# Patient Record
Sex: Male | Born: 1975 | Race: Black or African American | Hispanic: No | Marital: Married | Smoking: Never smoker
Health system: Southern US, Community
[De-identification: ages and names within clinical notes are randomized; demographics above are authoritative.]

---

## 1999-01-05 ENCOUNTER — Emergency Department (HOSPITAL_COMMUNITY): Admission: EM | Admit: 1999-01-05 | Discharge: 1999-01-05 | Payer: Self-pay | Admitting: *Deleted

## 2001-06-27 ENCOUNTER — Emergency Department (HOSPITAL_COMMUNITY): Admission: EM | Admit: 2001-06-27 | Discharge: 2001-06-28 | Payer: Self-pay | Admitting: *Deleted

## 2001-12-03 ENCOUNTER — Encounter: Payer: Self-pay | Admitting: Emergency Medicine

## 2001-12-03 ENCOUNTER — Emergency Department (HOSPITAL_COMMUNITY): Admission: EM | Admit: 2001-12-03 | Discharge: 2001-12-03 | Payer: Self-pay | Admitting: Emergency Medicine

## 2009-09-03 ENCOUNTER — Emergency Department (HOSPITAL_COMMUNITY): Admission: EM | Admit: 2009-09-03 | Discharge: 2009-09-03 | Payer: Self-pay | Admitting: Emergency Medicine

## 2010-09-20 LAB — BASIC METABOLIC PANEL
BUN: 16 mg/dL (ref 6–23)
CO2: 27 mEq/L (ref 19–32)
Calcium: 9.1 mg/dL (ref 8.4–10.5)
Chloride: 103 mEq/L (ref 96–112)
Creatinine, Ser: 1.18 mg/dL (ref 0.4–1.5)
GFR calc Af Amer: >60 mL/min (ref >60)
GFR calc non Af Amer: >60 mL/min (ref >60)
Glucose, Bld: 141 mg/dL — ABNORMAL HIGH (ref 70–99)
Potassium: 3.4 mEq/L — ABNORMAL LOW (ref 3.5–5.1)
Sodium: 136 mEq/L (ref 135–145)

## 2010-09-20 LAB — CBC
HCT: 41.6 % (ref 39.0–52.0)
Hemoglobin: 13.4 g/dL (ref 13.0–17.0)
MCHC: 32.3 g/dL (ref 30.0–36.0)
MCV: 88.9 fL (ref 78.0–100.0)
RBC: 4.68 MIL/uL (ref 4.22–5.81)

## 2010-09-20 LAB — URINALYSIS, ROUTINE W REFLEX MICROSCOPIC
Bilirubin Urine: NEGATIVE
Glucose, UA: NEGATIVE mg/dL
Hgb urine dipstick: NEGATIVE
Ketones, ur: NEGATIVE mg/dL
Nitrite: NEGATIVE
Protein, ur: NEGATIVE mg/dL
Specific Gravity, Urine: 1.023 (ref 1.005–1.030)
Urobilinogen, UA: 0.2 mg/dL (ref 0.0–1.0)
pH: 7 (ref 5.0–8.0)

## 2010-09-20 LAB — DIFFERENTIAL
Basophils Relative: 1 % (ref 0–1)
Eosinophils Absolute: 0.3 10*3/uL (ref 0.0–0.7)
Monocytes Absolute: 0.6 10*3/uL (ref 0.1–1.0)
Monocytes Relative: 8 % (ref 3–12)

## 2014-12-04 ENCOUNTER — Ambulatory Visit (INDEPENDENT_AMBULATORY_CARE_PROVIDER_SITE_OTHER): Payer: 59 | Admitting: Physician Assistant

## 2014-12-04 VITALS — BP 122/78 | HR 61 | Temp 97.8°F | Resp 17 | Ht 66.5 in | Wt 160.8 lb

## 2014-12-04 DIAGNOSIS — Z1329 Encounter for screening for other suspected endocrine disorder: Secondary | ICD-10-CM

## 2014-12-04 DIAGNOSIS — Z130 Encounter for screening for diseases of the blood and blood-forming organs and certain disorders involving the immune mechanism: Secondary | ICD-10-CM

## 2014-12-04 DIAGNOSIS — Z1389 Encounter for screening for other disorder: Secondary | ICD-10-CM

## 2014-12-04 DIAGNOSIS — Z0000 Encounter for general adult medical examination without abnormal findings: Secondary | ICD-10-CM | POA: Diagnosis not present

## 2014-12-04 DIAGNOSIS — Z131 Encounter for screening for diabetes mellitus: Secondary | ICD-10-CM

## 2014-12-04 LAB — POCT CBC
GRANULOCYTE PERCENT: 43 % (ref 37–80)
HCT, POC: 42.9 % — AB (ref 43.5–53.7)
Hemoglobin: 14.4 g/dL (ref 14.1–18.1)
Lymph, poc: 2.3 (ref 0.6–3.4)
MCH, POC: 28.4 pg (ref 27–31.2)
MCHC: 33.5 g/dL (ref 31.8–35.4)
MCV: 84.8 fL (ref 80–97)
MID (cbc): 0.3 (ref 0–0.9)
MPV: 6.4 fL (ref 0–99.8)
POC GRANULOCYTE: 2 (ref 2–6.9)
POC LYMPH PERCENT: 50 %L (ref 10–50)
POC MID %: 7 % (ref 0–12)
Platelet Count, POC: 283 10*3/uL (ref 142–424)
RBC: 5.05 M/uL (ref 4.69–6.13)
RDW, POC: 13.3 %
WBC: 4.7 10*3/uL (ref 4.6–10.2)

## 2014-12-04 LAB — POCT GLYCOSYLATED HEMOGLOBIN (HGB A1C): HEMOGLOBIN A1C: 5.5

## 2014-12-04 NOTE — Progress Notes (Signed)
12/04/2014 at 6:00 PM  Ronald Caldwell / DOB: 1976-01-21 / MRN: [id number]  The patient  does not have a problem list on file.  SUBJECTIVE  Chief complaint: Annual Exam  Patient here for an annual exam. Ronald Caldwell has no complaints.  Report Ronald Caldwell had a tetanus shot roughly five to seven years ago. Ronald Caldwell does not smoke, drink, and denies illicit drugs.  Denies anhedonia and dysthymia. Denies a family history of prostate cancer. Denies personal and family history of diabetes and CAD. Ronald Caldwell has been screened negative for HIV in the past and has had no new partners since that time. Ronald Caldwell has never been screened for syphilis.   Ronald Caldwell  has no past medical history on file.    Medications reviewed and updated by myself where necessary, and exist elsewhere in the encounter.   Ronald Caldwell has no allergies on file. Ronald Caldwell  reports that Ronald Caldwell has never smoked. Ronald Caldwell does not have any smokeless tobacco history on file. Ronald Caldwell  has no sexual activity history on file. The patient  has no past surgical history on file.  Ronald Caldwell family history includes Hypertension in Ronald Caldwell maternal grandmother.  Review of Systems  Constitutional: Negative for fever and chills.  Eyes: Negative.   Respiratory: Negative.   Cardiovascular: Negative.   Gastrointestinal: Negative.   Genitourinary: Negative.   Musculoskeletal: Negative.   Skin: Negative for itching and rash.  Neurological: Negative for dizziness and headaches.  Psychiatric/Behavioral: Negative for depression. The patient is not nervous/anxious.     OBJECTIVE  Ronald Caldwell  height is 5' 6.5" (1.689 m) and weight is 160 lb 12.8 oz (72.938 kg). Ronald Caldwell oral temperature is 97.8 F (36.6 C). Ronald Caldwell blood pressure is 122/78 and Ronald Caldwell pulse is 61. Ronald Caldwell respiration is 17 and oxygen saturation is 99%.  The patient's body mass index is 25.57 kg/(m^2).  Physical Exam  Vitals reviewed. Constitutional: Ronald Caldwell is oriented to person, place, and time. Ronald Caldwell appears well-developed and well-nourished.  HENT:  Head: Normocephalic.    Right Ear: External ear normal.  Left Ear: External ear normal.  Nose: Nose normal.  Mouth/Throat: No oropharyngeal exudate.  Eyes: Conjunctivae are normal. Pupils are equal, round, and reactive to light.  Neck: Normal range of motion.  Cardiovascular: Normal rate and regular rhythm.   Respiratory: Effort normal and breath sounds normal.  GI: Soft. Bowel sounds are normal. Ronald Caldwell exhibits no distension and no mass. There is no tenderness. There is no rebound and no guarding.  Genitourinary: Rectum normal, prostate normal and penis normal. Guaiac negative stool. No penile tenderness.  Musculoskeletal: Normal range of motion.  Neurological: Ronald Caldwell is alert and oriented to person, place, and time. Ronald Caldwell displays normal reflexes. No cranial nerve deficit. Ronald Caldwell exhibits normal muscle tone. Coordination normal.  Skin: Skin is dry. No rash noted. No erythema. No pallor.  Psychiatric: Ronald Caldwell has a normal mood and affect. Ronald Caldwell behavior is normal. Judgment and thought content normal.    Results for orders placed or performed in visit on 12/04/14 (from the past 24 hour(s))  POCT glycosylated hemoglobin (Hb A1C)     Status: None   Collection Time: 12/04/14  5:59 PM  Result Value Ref Range   Hemoglobin A1C 5.5   POCT CBC     Status: Abnormal   Collection Time: 12/04/14  5:59 PM  Result Value Ref Range   WBC 4.7 4.6 - 10.2 K/uL   Lymph, poc 2.3 0.6 - 3.4   POC LYMPH PERCENT 50.0 10 - 50 %L  MID (cbc) 0.3 0 - 0.9   POC MID % 7.0 0 - 12 %M   POC Granulocyte 2.0 2 - 6.9   Granulocyte percent 43.0 37 - 80 %G   RBC 5.05 4.69 - 6.13 M/uL   Hemoglobin 14.4 14.1 - 18.1 g/dL   HCT, POC 04.5 (A) 40.9 - 53.7 %   MCV 84.8 80 - 97 fL   MCH, POC 28.4 27 - 31.2 pg   MCHC 33.5 31.8 - 35.4 g/dL   RDW, POC 81.1 %   Platelet Count, POC 283 142 - 424 K/uL   MPV 6.4 0 - 99.8 fL    ASSESSMENT & PLAN  Arvie was seen today for annual exam.  Diagnoses and all orders for this visit:  Annual physical exam  Screening for  deficiency anemia Orders: -     POCT CBC  Screening for nephropathy Orders: -     Comprehensive metabolic panel  Screening for diabetes mellitus Orders: -     POCT glycosylated hemoglobin (Hb A1C)  Thyroid disorder screen Orders: -     TSH      The patient was advised to call or come back to clinic if Ronald Caldwell does not see an improvement in symptoms, or worsens with the above plan.   Deliah Boston, MHS, PA-C [facility] 12/04/2014 6:00 PM

## 2014-12-05 LAB — TSH: TSH: 3.56 u[IU]/mL (ref 0.350–4.500)

## 2014-12-05 LAB — COMPREHENSIVE METABOLIC PANEL
ALK PHOS: 43 U/L (ref 39–117)
ALT: 19 U/L (ref 0–53)
AST: 22 U/L (ref 0–37)
Albumin: 4.8 g/dL (ref 3.5–5.2)
BILIRUBIN TOTAL: 1.7 mg/dL — AB (ref 0.2–1.2)
BUN: 9 mg/dL (ref 6–23)
CO2: 26 meq/L (ref 19–32)
Calcium: 9.7 mg/dL (ref 8.4–10.5)
Chloride: 101 mEq/L (ref 96–112)
Creat: 1.12 mg/dL (ref 0.50–1.35)
GLUCOSE: 88 mg/dL (ref 70–99)
POTASSIUM: 4.7 meq/L (ref 3.5–5.3)
SODIUM: 136 meq/L (ref 135–145)
Total Protein: 7.8 g/dL (ref 6.0–8.3)

## 2014-12-08 ENCOUNTER — Encounter: Payer: Self-pay | Admitting: Family Medicine

## 2015-05-11 ENCOUNTER — Telehealth: Payer: Self-pay

## 2015-05-11 NOTE — Telephone Encounter (Signed)
PT DROPPED OFF TEACHER PE FORM TO BE COMPLETED   pHONE [phone]

## 2015-05-12 NOTE — Telephone Encounter (Signed)
Patient called to check on status of his phy form by Sherril CroonMike Clark   [phone]

## 2015-05-13 NOTE — Telephone Encounter (Signed)
This was completed last night. Deliah BostonMichael Clark, MS, PA-C 2:20 PM, 05/13/2015

## 2015-10-15 ENCOUNTER — Ambulatory Visit (INDEPENDENT_AMBULATORY_CARE_PROVIDER_SITE_OTHER): Payer: 59 | Admitting: Family Medicine

## 2015-10-15 ENCOUNTER — Encounter: Payer: Self-pay | Admitting: Family Medicine

## 2015-10-15 VITALS — BP 122/83 | HR 58 | Ht 66.0 in | Wt 162.0 lb

## 2015-10-15 DIAGNOSIS — M25561 Pain in right knee: Secondary | ICD-10-CM | POA: Diagnosis not present

## 2015-10-15 NOTE — Patient Instructions (Signed)
You have patellar tendinitis. Icing 15 minutes at a time 3-4 times a day when this is hurting you. Do straight leg raises, knee extensions, decline half-squats 3 sets of 10 once a day. Can add ankle weight if these become too easy. Ibuprofen 600mg  three times a day with food OR aleve 2 tabs twice a day with food for pain and inflammation. Consider nitro patches, physical therapy if not improving Use the knee brace or patellar strap when up and walking around. Follow up with me in 6 weeks.

## 2015-10-16 NOTE — Progress Notes (Signed)
PCP: No primary care provider on file.  Subjective:   HPI: Patient is a 40 y.o. male here for right knee pain.  Patient reports he's had anterior knee pain for about 4-5 years. Works a lot on his feet for up to 12 hours at a time. No injury or trauma. Pain localized anteriorly, worse with stairs and getting up and down. Pain level 3/10 at worst, dull. Is getting better shoes.   No catching, locking, giving out. No numbness, skin changes.  No past medical history on file.  No current outpatient prescriptions on file prior to visit.   No current facility-administered medications on file prior to visit.    No past surgical history on file.  Allergies  Allergen Reactions  . Penicillins     Social History   Social History  . Marital Status: Married    Spouse Name: N/A  . Number of Children: N/A  . Years of Education: N/A   Occupational History  . Not on file.   Social History Main Topics  . Smoking status: Never Smoker   . Smokeless tobacco: Not on file  . Alcohol Use: Not on file  . Drug Use: Not on file  . Sexual Activity: Not on file   Other Topics Concern  . Not on file   Social History Narrative    Family History  Problem Relation Age of Onset  . Hypertension Maternal Grandmother     BP 122/83 mmHg  Pulse 58  Ht 5\' 6"  (1.676 m)  Wt 162 lb (73.483 kg)  BMI 26.16 kg/m2  Review of Systems: See HPI above.    Objective:  Physical Exam:  Gen: NAD, comfortable in exam room  Right knee: No gross deformity, ecchymoses, swelling. No TTP currently - points to patellar tendon as area of pain when this comes on FROM. Negative ant/post drawers. Negative valgus/varus testing. Negative lachmanns. Negative mcmurrays, apleys, patellar apprehension. NV intact distally.  Left knee: FROM without pain.    MSK u/s:  Patellar tendon intact without tearing.  No effusion.  No other abnormalities. Assessment & Plan:  1. Right knee pain - 2/2 patellar  tendinitis.  Reassured patient.  Icing, home exercises reviewed.  Ibuprofen or aleve if needed.  Patellar strap when up and walking around.  F/u in 6 weeks.  Consider physical therapy, nitro patches if not improving.

## 2015-10-19 DIAGNOSIS — M25561 Pain in right knee: Secondary | ICD-10-CM | POA: Insufficient documentation

## 2015-10-19 NOTE — Assessment & Plan Note (Signed)
2/2 patellar tendinitis.  Reassured patient.  Icing, home exercises reviewed.  Ibuprofen or aleve if needed.  Patellar strap when up and walking around.  F/u in 6 weeks.  Consider physical therapy, nitro patches if not improving.

## 2016-03-13 ENCOUNTER — Emergency Department (HOSPITAL_COMMUNITY): Payer: PRIVATE HEALTH INSURANCE

## 2016-03-13 ENCOUNTER — Emergency Department (HOSPITAL_COMMUNITY)
Admission: EM | Admit: 2016-03-13 | Discharge: 2016-03-13 | Disposition: A | Discharge status: Home / Self Care | Payer: PRIVATE HEALTH INSURANCE | Attending: Emergency Medicine | Admitting: Emergency Medicine

## 2016-03-13 ENCOUNTER — Encounter (HOSPITAL_COMMUNITY): Payer: Self-pay

## 2016-03-13 DIAGNOSIS — S60131A Contusion of right middle finger with damage to nail, initial encounter: Secondary | ICD-10-CM | POA: Diagnosis not present

## 2016-03-13 DIAGNOSIS — W231XXA Caught, crushed, jammed, or pinched between stationary objects, initial encounter: Secondary | ICD-10-CM | POA: Diagnosis not present

## 2016-03-13 DIAGNOSIS — Y9289 Other specified places as the place of occurrence of the external cause: Secondary | ICD-10-CM | POA: Insufficient documentation

## 2016-03-13 DIAGNOSIS — Y939 Activity, unspecified: Secondary | ICD-10-CM | POA: Diagnosis not present

## 2016-03-13 DIAGNOSIS — S6991XA Unspecified injury of right wrist, hand and finger(s), initial encounter: Secondary | ICD-10-CM

## 2016-03-13 DIAGNOSIS — S6010XA Contusion of unspecified finger with damage to nail, initial encounter: Secondary | ICD-10-CM

## 2016-03-13 DIAGNOSIS — M79644 Pain in right finger(s): Secondary | ICD-10-CM | POA: Diagnosis not present

## 2016-03-13 DIAGNOSIS — Y999 Unspecified external cause status: Secondary | ICD-10-CM | POA: Diagnosis not present

## 2016-03-13 MED ORDER — ACETAMINOPHEN 500 MG PO TABS
1000.0000 mg | ORAL_TABLET | Freq: Once | ORAL | Status: AC
  Administered 2016-03-13: 1000 mg via ORAL
  Filled 2016-03-13: qty 2

## 2016-03-13 NOTE — ED Provider Notes (Signed)
[facility]-EMERGENCY DEPT Provider Note   CSN: [id number] Arrival date & time: 03/13/16  1431  By signing my name below, I, Christel MormonMatthew Jamison, attest that this documentation has been prepared under the direction and in the presence of Roxy Horsemanobert Greely Atiyeh, PA-C.  Electronically Signed: Christel MormonMatthew Jamison, Scribe. 03/13/2016. 4:12 PM.   History   Chief Complaint Chief Complaint  Patient presents with  . Hand Injury     The history is provided by the patient. No language interpreter was used.   HPI Comments:  Ronald Caldwell is a 40 y.o. male who presents to the Emergency Department s/p a an injury to his R middle finger that occurred at [facility] today. Pt states that he slammed his R middle finger in a laundry chute this afternoon.  History reviewed. No pertinent past medical history.  Patient Active Problem List   Diagnosis Date Noted  . Right knee pain 10/19/2015    History reviewed. No pertinent surgical history.     Home Medications    Prior to Admission medications   Not on File    Family History Family History  Problem Relation Age of Onset  . Hypertension Maternal Grandmother     Social History Social History  Substance Use Topics  . Smoking status: Never Smoker  . Smokeless tobacco: Never Used  . Alcohol use No     Allergies   Penicillins   Review of Systems Review of Systems  Constitutional: Positive for fever.  Musculoskeletal: Positive for arthralgias.     Physical Exam Updated Vital Signs BP 125/88 (BP Location: Left Arm)   Pulse (!) 51   Temp 98.1 F (36.7 C) (Oral)   Resp 18   Ht 5\' 6"  (1.676 m)   Wt 160 lb (72.6 kg)   SpO2 97%   BMI 25.82 kg/m   Physical Exam Physical Exam  Constitutional: Pt appears well-developed and well-nourished. No distress.  HENT:  Head: Normocephalic and atraumatic.  Eyes: Conjunctivae are normal.  Neck: Normal range of motion.  Cardiovascular: Normal rate, regular rhythm and intact distal pulses.   Capillary  refill < 3 sec  Pulmonary/Chest: Effort normal and breath sounds normal.  Musculoskeletal: Pt exhibits tenderness to palpation over the distal middle finger. Pt exhibits no edema.  ROM: 5/5  Neurological: Pt  is alert. Coordination normal.  Sensation 5/5 Strength 5/5  Skin: Skin is warm and dry. Pt is not diaphoretic.  No tenting of the skin Subungual hematoma  Psychiatric: Pt has a normal mood and affect.  Nursing note and vitals reviewed.   ED Treatments / Results  DIAGNOSTIC STUDIES:  Oxygen Saturation is 97% on RA, normal by my interpretation.    COORDINATION OF CARE:  4:12 PM Discussed treatment plan with pt at bedside and pt agreed to plan.   Labs (all labs ordered are listed, but only abnormal results are displayed) Labs Reviewed - No data to display  EKG  EKG Interpretation None       Radiology Dg Finger Middle Right  Result Date: 03/13/2016 CLINICAL DATA:  Acute right middle finger pain following door injury today. Initial encounter. EXAM: RIGHT MIDDLE FINGER 2+V COMPARISON:  None. FINDINGS: There is no evidence of fracture or dislocation. There is no evidence of arthropathy or other focal bone abnormality. Soft tissues are unremarkable. IMPRESSION: Negative. Electronically Signed   By: Harmon PierJeffrey  Hu M.D.   On: 03/13/2016 15:56    Procedures Procedures (including critical care time)  Medications Ordered in ED Medications - No data to  display   Initial Impression / Assessment and Plan / ED Course  I have reviewed the triage vital signs and the nursing notes.  Pertinent labs & imaging results that were available during my care of the patient were reviewed by me and considered in my medical decision making (see chart for details).  Clinical Course    Patient with right middle finger injury.  Slammed in door.  Subungual hematoma.  Plain films are negative.  Subungual hematoma evacuated.  Final Clinical Impressions(s) / ED Diagnoses   Final diagnoses:    Finger injury, right, initial encounter  Subungual hematoma of digit of hand, initial encounter    New Prescriptions There are no discharge medications for this patient. I personally performed the services described in this documentation, which was scribed in my presence. The recorded information has been reviewed and is accurate.       Roxy Horseman, PA-C 03/13/16 2040    Doug Sou, MD 03/14/16 [phone]

## 2016-03-13 NOTE — ED Notes (Signed)
Declined W/C at D/C and was escorted to lobby by RN. 

## 2016-03-13 NOTE — ED Triage Notes (Signed)
Per Pt, Pt is coming from work with complaints of slamming right middle finger in the laundry shoot. Pt has finger on ice. Pt is able to move finger and circulation noted to be intact.

## 2016-04-27 DIAGNOSIS — Z131 Encounter for screening for diabetes mellitus: Secondary | ICD-10-CM | POA: Diagnosis not present

## 2016-04-27 DIAGNOSIS — Z0000 Encounter for general adult medical examination without abnormal findings: Secondary | ICD-10-CM | POA: Diagnosis not present

## 2016-04-27 DIAGNOSIS — Z125 Encounter for screening for malignant neoplasm of prostate: Secondary | ICD-10-CM | POA: Diagnosis not present

## 2016-04-27 DIAGNOSIS — H6120 Impacted cerumen, unspecified ear: Secondary | ICD-10-CM | POA: Diagnosis not present

## 2016-04-27 DIAGNOSIS — M25561 Pain in right knee: Secondary | ICD-10-CM | POA: Diagnosis not present

## 2016-04-27 DIAGNOSIS — Z13220 Encounter for screening for lipoid disorders: Secondary | ICD-10-CM | POA: Diagnosis not present

## 2017-08-15 DIAGNOSIS — H5213 Myopia, bilateral: Secondary | ICD-10-CM | POA: Diagnosis not present

## 2018-03-29 ENCOUNTER — Emergency Department (HOSPITAL_COMMUNITY): Payer: 59

## 2018-03-29 ENCOUNTER — Encounter (HOSPITAL_COMMUNITY): Payer: Self-pay

## 2018-03-29 ENCOUNTER — Emergency Department (HOSPITAL_COMMUNITY)
Admission: EM | Admit: 2018-03-29 | Discharge: 2018-03-29 | Disposition: A | Discharge status: Home / Self Care | Payer: 59 | Attending: Emergency Medicine | Admitting: Emergency Medicine

## 2018-03-29 DIAGNOSIS — S40021A Contusion of right upper arm, initial encounter: Secondary | ICD-10-CM | POA: Diagnosis not present

## 2018-03-29 DIAGNOSIS — S59911A Unspecified injury of right forearm, initial encounter: Secondary | ICD-10-CM | POA: Diagnosis not present

## 2018-03-29 DIAGNOSIS — S59912A Unspecified injury of left forearm, initial encounter: Secondary | ICD-10-CM | POA: Diagnosis not present

## 2018-03-29 DIAGNOSIS — M25511 Pain in right shoulder: Secondary | ICD-10-CM | POA: Insufficient documentation

## 2018-03-29 DIAGNOSIS — M79631 Pain in right forearm: Secondary | ICD-10-CM

## 2018-03-29 DIAGNOSIS — M7989 Other specified soft tissue disorders: Secondary | ICD-10-CM | POA: Diagnosis not present

## 2018-03-29 DIAGNOSIS — S4991XA Unspecified injury of right shoulder and upper arm, initial encounter: Secondary | ICD-10-CM | POA: Diagnosis not present

## 2018-03-29 DIAGNOSIS — Y939 Activity, unspecified: Secondary | ICD-10-CM | POA: Diagnosis not present

## 2018-03-29 DIAGNOSIS — S199XXA Unspecified injury of neck, initial encounter: Secondary | ICD-10-CM | POA: Diagnosis not present

## 2018-03-29 DIAGNOSIS — Y92410 Unspecified street and highway as the place of occurrence of the external cause: Secondary | ICD-10-CM | POA: Insufficient documentation

## 2018-03-29 DIAGNOSIS — Y999 Unspecified external cause status: Secondary | ICD-10-CM | POA: Diagnosis not present

## 2018-03-29 DIAGNOSIS — M542 Cervicalgia: Secondary | ICD-10-CM | POA: Insufficient documentation

## 2018-03-29 DIAGNOSIS — M79632 Pain in left forearm: Secondary | ICD-10-CM | POA: Diagnosis not present

## 2018-03-29 MED ORDER — METHOCARBAMOL 500 MG PO TABS: 500.0000 mg | ORAL_TABLET | Freq: Two times a day (BID) | ORAL | 0 refills | Status: DC

## 2018-03-29 MED ORDER — KETOROLAC TROMETHAMINE 30 MG/ML IJ SOLN
30.0000 mg | Freq: Once | INTRAMUSCULAR | Status: AC
  Administered 2018-03-29: 30 mg via INTRAMUSCULAR
  Filled 2018-03-29: qty 1

## 2018-03-29 MED ORDER — NAPROXEN 500 MG PO TABS: 500.0000 mg | ORAL_TABLET | Freq: Two times a day (BID) | ORAL | 0 refills | Status: DC

## 2018-03-29 NOTE — ED Notes (Signed)
Declined W/C at D/C and was escorted to lobby by RN. 

## 2018-03-29 NOTE — ED Triage Notes (Signed)
Pt walked to BR without difficulty.
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
## 2018-06-26 ENCOUNTER — Ambulatory Visit (INDEPENDENT_AMBULATORY_CARE_PROVIDER_SITE_OTHER): Payer: 59 | Admitting: Orthopaedic Surgery

## 2018-06-26 ENCOUNTER — Encounter (INDEPENDENT_AMBULATORY_CARE_PROVIDER_SITE_OTHER): Payer: Self-pay | Admitting: Orthopaedic Surgery

## 2018-06-26 ENCOUNTER — Ambulatory Visit (INDEPENDENT_AMBULATORY_CARE_PROVIDER_SITE_OTHER): Payer: 59

## 2018-06-26 VITALS — BP 117/78 | HR 55 | Ht 66.0 in | Wt 160.0 lb

## 2018-06-26 DIAGNOSIS — M25561 Pain in right knee: Secondary | ICD-10-CM

## 2018-06-26 DIAGNOSIS — M2241 Chondromalacia patellae, right knee: Secondary | ICD-10-CM | POA: Diagnosis not present

## 2018-06-26 DIAGNOSIS — G8929 Other chronic pain: Secondary | ICD-10-CM
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
## 2018-07-30 ENCOUNTER — Telehealth (INDEPENDENT_AMBULATORY_CARE_PROVIDER_SITE_OTHER): Payer: Self-pay | Admitting: Orthopaedic Surgery

## 2018-07-30 DIAGNOSIS — M2241 Chondromalacia patellae, right knee: Secondary | ICD-10-CM

## 2018-07-30 NOTE — Telephone Encounter (Signed)
New order entered

## 2018-07-30 NOTE — Telephone Encounter (Signed)
Patient states he need a new referral sent to PT @ [facility].

## 2018-07-30 NOTE — Telephone Encounter (Signed)
I attempted to reach patient to advise. Phone number disconnected.

## 2018-07-30 NOTE — Telephone Encounter (Signed)
Ok thanks 

## 2018-07-30 NOTE — Telephone Encounter (Signed)
Per referral notes in Epic, patient did not return calls to schedule. OK for new referral?

## 2018-08-08 ENCOUNTER — Encounter: Payer: Self-pay | Admitting: Physical Therapy

## 2018-08-08 ENCOUNTER — Ambulatory Visit: Payer: 59 | Attending: Orthopaedic Surgery | Admitting: Physical Therapy

## 2018-08-08 ENCOUNTER — Other Ambulatory Visit: Payer: Self-pay

## 2018-08-08 DIAGNOSIS — R29898 Other symptoms and signs involving the musculoskeletal system: Secondary | ICD-10-CM | POA: Insufficient documentation

## 2018-08-08 DIAGNOSIS — M25561 Pain in right knee: Secondary | ICD-10-CM | POA: Diagnosis not present

## 2018-08-08 DIAGNOSIS — M6281 Muscle weakness (generalized): Secondary | ICD-10-CM | POA: Insufficient documentation

## 2018-08-08 DIAGNOSIS — G8929 Other chronic pain: Secondary | ICD-10-CM | POA: Diagnosis not present
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
## 2018-08-14 ENCOUNTER — Ambulatory Visit: Payer: 59

## 2018-08-14 DIAGNOSIS — R29898 Other symptoms and signs involving the musculoskeletal system: Secondary | ICD-10-CM | POA: Diagnosis not present

## 2018-08-14 DIAGNOSIS — M6281 Muscle weakness (generalized): Secondary | ICD-10-CM

## 2018-08-14 DIAGNOSIS — M25561 Pain in right knee: Secondary | ICD-10-CM | POA: Diagnosis not present

## 2018-08-14 DIAGNOSIS — G8929 Other chronic pain: Secondary | ICD-10-CM
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
## 2018-08-16 ENCOUNTER — Encounter: Payer: Self-pay | Admitting: Physical Therapy

## 2018-08-16 ENCOUNTER — Ambulatory Visit: Payer: 59 | Admitting: Physical Therapy

## 2018-08-16 DIAGNOSIS — M6281 Muscle weakness (generalized): Secondary | ICD-10-CM

## 2018-08-16 DIAGNOSIS — R29898 Other symptoms and signs involving the musculoskeletal system: Secondary | ICD-10-CM | POA: Diagnosis not present

## 2018-08-16 DIAGNOSIS — G8929 Other chronic pain: Secondary | ICD-10-CM

## 2018-08-16 DIAGNOSIS — M25561 Pain in right knee: Principal | ICD-10-CM
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
## 2018-08-16 NOTE — Patient Instructions (Signed)

## 2018-08-21 ENCOUNTER — Ambulatory Visit: Payer: 59

## 2018-08-21 DIAGNOSIS — M6281 Muscle weakness (generalized): Secondary | ICD-10-CM | POA: Diagnosis not present

## 2018-08-21 DIAGNOSIS — R29898 Other symptoms and signs involving the musculoskeletal system: Secondary | ICD-10-CM

## 2018-08-21 DIAGNOSIS — G8929 Other chronic pain: Secondary | ICD-10-CM

## 2018-08-21 DIAGNOSIS — M25561 Pain in right knee: Principal | ICD-10-CM
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
## 2018-08-23 ENCOUNTER — Encounter: Payer: Self-pay | Admitting: Physical Therapy

## 2018-08-23 ENCOUNTER — Ambulatory Visit: Payer: 59 | Admitting: Physical Therapy

## 2018-08-23 DIAGNOSIS — M6281 Muscle weakness (generalized): Secondary | ICD-10-CM | POA: Diagnosis not present

## 2018-08-23 DIAGNOSIS — R29898 Other symptoms and signs involving the musculoskeletal system: Secondary | ICD-10-CM | POA: Diagnosis not present

## 2018-08-23 DIAGNOSIS — G8929 Other chronic pain: Secondary | ICD-10-CM

## 2018-08-23 DIAGNOSIS — M25561 Pain in right knee: Principal | ICD-10-CM
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
## 2018-08-28 ENCOUNTER — Ambulatory Visit: Payer: 59 | Attending: Orthopaedic Surgery

## 2018-08-28 DIAGNOSIS — R29898 Other symptoms and signs involving the musculoskeletal system: Secondary | ICD-10-CM | POA: Diagnosis not present

## 2018-08-28 DIAGNOSIS — M25561 Pain in right knee: Secondary | ICD-10-CM | POA: Diagnosis not present

## 2018-08-28 DIAGNOSIS — M6281 Muscle weakness (generalized): Secondary | ICD-10-CM | POA: Insufficient documentation

## 2018-08-28 DIAGNOSIS — G8929 Other chronic pain: Secondary | ICD-10-CM | POA: Diagnosis not present
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
## 2018-08-30 ENCOUNTER — Encounter: Payer: Self-pay | Admitting: Physical Therapy

## 2018-08-30 ENCOUNTER — Ambulatory Visit: Payer: 59 | Admitting: Physical Therapy

## 2018-08-30 DIAGNOSIS — G8929 Other chronic pain: Secondary | ICD-10-CM | POA: Diagnosis not present

## 2018-08-30 DIAGNOSIS — M6281 Muscle weakness (generalized): Secondary | ICD-10-CM

## 2018-08-30 DIAGNOSIS — R29898 Other symptoms and signs involving the musculoskeletal system: Secondary | ICD-10-CM | POA: Diagnosis not present

## 2018-08-30 DIAGNOSIS — M25561 Pain in right knee: Secondary | ICD-10-CM | POA: Diagnosis not present
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
## 2018-09-04 ENCOUNTER — Ambulatory Visit: Payer: 59

## 2018-09-04 DIAGNOSIS — G8929 Other chronic pain: Secondary | ICD-10-CM

## 2018-09-04 DIAGNOSIS — M6281 Muscle weakness (generalized): Secondary | ICD-10-CM | POA: Diagnosis not present

## 2018-09-04 DIAGNOSIS — R29898 Other symptoms and signs involving the musculoskeletal system: Secondary | ICD-10-CM | POA: Diagnosis not present

## 2018-09-04 DIAGNOSIS — M25561 Pain in right knee: Secondary | ICD-10-CM | POA: Diagnosis not present
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
## 2018-09-06 ENCOUNTER — Ambulatory Visit: Payer: 59 | Admitting: Physical Therapy

## 2018-09-11 ENCOUNTER — Ambulatory Visit: Payer: 59

## 2018-09-11 ENCOUNTER — Other Ambulatory Visit: Payer: Self-pay

## 2018-09-11 DIAGNOSIS — M25561 Pain in right knee: Secondary | ICD-10-CM | POA: Diagnosis not present

## 2018-09-11 DIAGNOSIS — G8929 Other chronic pain: Secondary | ICD-10-CM

## 2018-09-11 DIAGNOSIS — M6281 Muscle weakness (generalized): Secondary | ICD-10-CM | POA: Diagnosis not present

## 2018-09-11 DIAGNOSIS — R29898 Other symptoms and signs involving the musculoskeletal system: Secondary | ICD-10-CM
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
## 2018-09-13 ENCOUNTER — Ambulatory Visit: Payer: 59

## 2018-09-18 ENCOUNTER — Ambulatory Visit: Payer: 59

## 2018-09-20 ENCOUNTER — Ambulatory Visit: Payer: 59 | Admitting: Physical Therapy

## 2018-09-20 ENCOUNTER — Other Ambulatory Visit: Payer: Self-pay

## 2018-09-20 ENCOUNTER — Encounter: Payer: Self-pay | Admitting: Physical Therapy

## 2018-09-20 DIAGNOSIS — G8929 Other chronic pain: Secondary | ICD-10-CM | POA: Diagnosis not present

## 2018-09-20 DIAGNOSIS — R29898 Other symptoms and signs involving the musculoskeletal system: Secondary | ICD-10-CM

## 2018-09-20 DIAGNOSIS — M6281 Muscle weakness (generalized): Secondary | ICD-10-CM | POA: Diagnosis not present

## 2018-09-20 DIAGNOSIS — M25561 Pain in right knee: Secondary | ICD-10-CM | POA: Diagnosis not present
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
## 2019-02-01 ENCOUNTER — Emergency Department (HOSPITAL_COMMUNITY)
Admission: EM | Admit: 2019-02-01 | Discharge: 2019-02-01 | Disposition: A | Discharge status: Home / Self Care | Payer: 59 | Attending: Emergency Medicine | Admitting: Emergency Medicine

## 2019-02-01 ENCOUNTER — Encounter (HOSPITAL_COMMUNITY): Payer: Self-pay | Admitting: Student

## 2019-02-01 ENCOUNTER — Other Ambulatory Visit: Payer: Self-pay

## 2019-02-01 DIAGNOSIS — Y999 Unspecified external cause status: Secondary | ICD-10-CM | POA: Diagnosis not present

## 2019-02-01 DIAGNOSIS — Y93G1 Activity, food preparation and clean up: Secondary | ICD-10-CM | POA: Diagnosis not present

## 2019-02-01 DIAGNOSIS — W260XXA Contact with knife, initial encounter: Secondary | ICD-10-CM | POA: Insufficient documentation

## 2019-02-01 DIAGNOSIS — Y929 Unspecified place or not applicable: Secondary | ICD-10-CM | POA: Insufficient documentation

## 2019-02-01 DIAGNOSIS — S61012A Laceration without foreign body of left thumb without damage to nail, initial encounter: Secondary | ICD-10-CM | POA: Insufficient documentation

## 2019-02-01 DIAGNOSIS — Z23 Encounter for immunization: Secondary | ICD-10-CM | POA: Insufficient documentation

## 2019-02-01 MED ORDER — LIDOCAINE HCL (PF) 1 % IJ SOLN
5.0000 mL | Freq: Once | INTRAMUSCULAR | Status: AC
  Administered 2019-02-01: 5 mL
  Filled 2019-02-01: qty 5

## 2019-02-01 MED ORDER — BACITRACIN ZINC 500 UNIT/GM EX OINT: TOPICAL_OINTMENT | Freq: Once | CUTANEOUS | Status: DC

## 2019-02-01 MED ORDER — TETANUS-DIPHTH-ACELL PERTUSSIS 5-2.5-18.5 LF-MCG/0.5 IM SUSP
0.5000 mL | Freq: Once | INTRAMUSCULAR | Status: AC
  Administered 2019-02-01: 0.5 mL via INTRAMUSCULAR
  Filled 2019-02-01: qty 0.5

## 2019-02-01 NOTE — Discharge Instructions (Addendum)
You were seen in the emergency department today for a laceration. Your laceration was closed with 4 stitches. Please keep this area clean and dry for the next 24 hours, after 24 hours you may get this area wet, but avoid soaking the area. Keep the area covered as best possible especially when in the sun to help in minimizing scarring.   Your tetanus has been updated   You will need to have the stitches removed and the wound rechecked in 7 days. Please return to the emergency department, go to an urgent care, or see your primary care provider to have this performed. Return to the ER soon should you start to experience pus type drainage from the wound, redness around the wound, or fevers as this could indicate the area is infected, please return to the ER for any other worsening symptoms or concerns that you may have.   

## 2019-02-01 NOTE — ED Provider Notes (Signed)
[facility] Note   CSN: [id number] Arrival date & time: 02/01/19  0022     History   Chief Complaint Chief Complaint  Patient presents with  . Extremity Laceration    HPI Ronald Caldwell is a 43 y.o. male without significant past medical hx who presents to the ED with complaints of R thumb laceration which occurred around 0100 this AM. Patient states he accidentally cut the thumb with a knife while cooking. Applied pressure to the wound with improvement. No other alleviating/aggravating factors. Wound burns/stings a bit, but no significant joint pain. Denies other areas of injury. Denies numbness, tingling, or weakness. Unknown last tetanus. Patient is R hand dominant.      HPI  No past medical history on file.  Patient Active Problem List   Diagnosis Date Noted  . 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 Right knee pain 10/19/2015    No past surgical history on file.      Home Medications    Prior to Admission medications   Medication Sig Start Date End Date Taking? Authorizing Provider  methocarbamol (ROBAXIN) 500 MG tablet Take 1 tablet (500 mg total) by mouth 2 (two) times daily. Patient not taking: Reported on 06/26/2018 03/29/18   Henderly, Britni A, PA-C  naproxen (NAPROSYN) 500 MG tablet Take 1 tablet (500 mg total) by mouth 2 (two) times daily. Patient not taking: Reported on 06/26/2018 03/29/18   Henderly, Britni A, PA-C    Family History Family History  Problem Relation Age of Onset  . Hypertension Maternal Grandmother     Social History Social History   Tobacco Use  . Smoking status: Never Smoker  . Smokeless tobacco: Never Used  Substance Use Topics  . Alcohol use: No    Alcohol/week: 0.0 standard drinks  . Drug use: No     Allergies   Penicillins   Review of Systems Review of Systems  Constitutional: Negative for chills and fever.  Musculoskeletal: Negative for arthralgias and  myalgias.  Skin: Positive for wound.  Neurological: Negative for weakness and numbness.       Negative for paresthesias.      Physical Exam Updated Vital Signs BP 140/77 (BP Location: Right Arm)   Pulse 70   Temp 98.1 F (36.7 C) (Oral)   Resp 16   SpO2 99%   Physical Exam Vitals signs and nursing note reviewed.  Constitutional:      General: He is not in acute distress.    Appearance: Normal appearance. He is not ill-appearing or toxic-appearing.  HENT:     Head: Normocephalic and atraumatic.  Neck:     Musculoskeletal: Normal range of motion and neck supple.     Comments: No midline tenderness.  Cardiovascular:     Rate and Rhythm: Normal rate.     Pulses:          Radial pulses are 2+ on the right side and 2+ on the left side.  Pulmonary:     Effort: No respiratory distress.     Breath sounds: Normal breath sounds.  Musculoskeletal:     Comments: Upper extremities: No obvious deformity, appreciable swelling, edema, erythema, ecchymosis, or warmth. There is a 2.5 cm V shaped laceration over the palmar surface of the mid L first finger. 2mm deep. NO active bleeding or appreciable FB. No appreciable tendon involvement. Patient has intact AROM throughout. Able to flex/extend L 1st IP/MCP against resistance. No bony tenderness to palpation.  Skin:    General: Skin is warm and dry.     Capillary Refill: Capillary refill takes less than 2 seconds.  Neurological:     Mental Status: He is alert.     Comments: Alert. Clear speech. Sensation grossly intact to bilateral upper extremities. 5/5 symmetric grip strength. Ambulatory.   Psychiatric:        Mood and Affect: Mood normal.        Behavior: Behavior normal.      ED Treatments / Results  Labs (all labs ordered are listed, but only abnormal results are displayed) Labs Reviewed - No data to display  EKG None  Radiology No results found.  Procedures .Marland KitchenLaceration Repair  Date/Time: 02/01/2019 5:54 AM Performed  by: Amaryllis Dyke, PA-C Authorized by: Amaryllis Dyke, PA-C   Consent:    Consent obtained:  Verbal   Consent given by:  Patient   Risks discussed:  Infection, need for additional repair, nerve damage, poor wound healing, poor cosmetic result, pain, retained foreign body, tendon damage and vascular damage   Alternatives discussed:  No treatment Anesthesia (see MAR for exact dosages):    Anesthesia method:  Nerve block   Block needle gauge:  25 G   Block anesthetic:  Lidocaine 1% w/o epi   Block injection procedure:  Anatomic landmarks identified, anatomic landmarks palpated, introduced needle, negative aspiration for blood and incremental injection   Block outcome:  Anesthesia achieved Laceration details:    Location:  Finger   Finger location:  L thumb   Length (cm):  2.5   Depth (mm):  2 Repair type:    Repair type:  Simple Pre-procedure details:    Preparation:  Patient was prepped and draped in usual sterile fashion Exploration:    Hemostasis achieved with:  Direct pressure   Wound exploration: wound explored through full range of motion and entire depth of wound probed and visualized   Treatment:    Area cleansed with:  Betadine   Amount of cleaning:  Standard   Irrigation solution:  Sterile saline   Irrigation method:  Pressure wash Skin repair:    Repair method:  Sutures   Suture size:  4-0   Suture material:  Nylon   Suture technique:  Simple interrupted   Number of sutures:  4 Approximation:    Approximation:  Close Post-procedure details:    Dressing:  Antibiotic ointment and non-adherent dressing   Patient tolerance of procedure:  Tolerated well, no immediate complications   (including critical care time)  Medications Ordered in ED Medications - No data to display   Initial Impression / Assessment and Plan / ED Course  I have reviewed the triage vital signs and the nursing notes.  Pertinent labs & imaging results that were available  during my care of the patient were reviewed by me and considered in my medical decision making (see chart for details).    Patient presents to the emergency department with laceration to L palmar first finger which occurred within a few hours PTA. Patient nontoxic appearing, resting comfortably. Based on injury and no bony tenderness or ROM limitations doubt fx/dislocation. Pressure irrigation performed. Wound explored and base of wound visualized in a bloodless field without evidence of foreign body. Laceration repair per procedure note above, tolerated well. Tetanus updated at today's visit. Discussed suture home care as well as need for wound recheck and suture removal in 7 days.  I discussed results, treatment plan, need for follow-up, and return precautions with  the patient including signs of infection. Provided opportunity for questions, patient confirmed understanding and is in agreement with plan.       Final Clinical Impressions(s) / ED Diagnoses   Final diagnoses:  Laceration of left thumb without foreign body without damage to nail, initial encounter    ED Discharge Orders    None       Cherly Andersonetrucelli, Christain Niznik R, PA-C 02/01/19 0557    Nira Connardama, Pedro Eduardo, MD 02/02/19 [phone]

## 2019-02-01 NOTE — ED Triage Notes (Signed)
tonight patient was in his kitchen cutting up fruit and caught his left thumb with the knife. Controlled bleeding at this time

## 2019-02-01 NOTE — ED Notes (Signed)
Wound cleaned and dressing applied.  

## 2019-02-01 NOTE — ED Notes (Signed)
States he was cutting up some vegs and lacerated his left thumb. Bleeding controled
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
## 2020-03-04 DIAGNOSIS — H5213 Myopia, bilateral: Secondary | ICD-10-CM | POA: Diagnosis not present

## 2020-03-13 ENCOUNTER — Other Ambulatory Visit: Payer: 59

## 2020-03-13 ENCOUNTER — Other Ambulatory Visit: Payer: Self-pay

## 2020-03-13 DIAGNOSIS — Z20822 Contact with and (suspected) exposure to covid-19: Secondary | ICD-10-CM

## 2020-03-16 LAB — NOVEL CORONAVIRUS, NAA: SARS-CoV-2, NAA: NOT DETECTED

## 2020-03-17 ENCOUNTER — Telehealth: Payer: Self-pay

## 2020-03-17 NOTE — Telephone Encounter (Signed)
Patient given results as noted not detected, he verbalized understanding. 

## 2020-04-24 DIAGNOSIS — Z111 Encounter for screening for respiratory tuberculosis: Secondary | ICD-10-CM | POA: Diagnosis not present

## 2021-03-04 DIAGNOSIS — H5213 Myopia, bilateral: Secondary | ICD-10-CM | POA: Diagnosis not present

## 2022-09-10 DIAGNOSIS — I498 Other specified cardiac arrhythmias: Secondary | ICD-10-CM | POA: Diagnosis not present

## 2022-09-10 DIAGNOSIS — M19021 Primary osteoarthritis, right elbow: Secondary | ICD-10-CM | POA: Diagnosis not present

## 2022-09-10 DIAGNOSIS — M25521 Pain in right elbow: Secondary | ICD-10-CM | POA: Diagnosis not present

## 2022-09-10 DIAGNOSIS — G44209 Tension-type headache, unspecified, not intractable: Secondary | ICD-10-CM | POA: Diagnosis not present

## 2022-09-10 DIAGNOSIS — R202 Paresthesia of skin: Secondary | ICD-10-CM | POA: Diagnosis not present

## 2022-09-10 DIAGNOSIS — M25561 Pain in right knee: Secondary | ICD-10-CM | POA: Diagnosis not present

## 2022-09-10 DIAGNOSIS — R42 Dizziness and giddiness: Secondary | ICD-10-CM | POA: Diagnosis not present

## 2022-09-10 DIAGNOSIS — R030 Elevated blood-pressure reading, without diagnosis of hypertension: Secondary | ICD-10-CM | POA: Diagnosis not present

## 2022-09-10 DIAGNOSIS — G8929 Other chronic pain: Secondary | ICD-10-CM | POA: Diagnosis not present

## 2022-09-10 DIAGNOSIS — R519 Headache, unspecified: Secondary | ICD-10-CM | POA: Diagnosis not present

## 2022-09-10 DIAGNOSIS — M79659 Pain in unspecified thigh: Secondary | ICD-10-CM | POA: Diagnosis not present

## 2022-09-10 DIAGNOSIS — R200 Anesthesia of skin: Secondary | ICD-10-CM | POA: Diagnosis not present

## 2022-09-11 DIAGNOSIS — M25561 Pain in right knee: Secondary | ICD-10-CM | POA: Diagnosis not present

## 2022-09-11 DIAGNOSIS — M19021 Primary osteoarthritis, right elbow: Secondary | ICD-10-CM | POA: Diagnosis not present
